# Patient Record
Sex: Female | Born: 1995 | Hispanic: No | Smoking: Current every day smoker
Health system: Southern US, Community
[De-identification: ages and names within clinical notes are randomized; demographics above are authoritative.]

## PROBLEM LIST (undated history)

## (undated) DIAGNOSIS — H5440 Blindness, one eye, unspecified eye: Secondary | ICD-10-CM

## (undated) DIAGNOSIS — E119 Type 2 diabetes mellitus without complications: Secondary | ICD-10-CM

---
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
## 2014-11-12 ENCOUNTER — Encounter (HOSPITAL_COMMUNITY): Payer: Self-pay | Admitting: Emergency Medicine

## 2014-11-12 ENCOUNTER — Emergency Department (HOSPITAL_COMMUNITY)
Admission: EM | Admit: 2014-11-12 | Discharge: 2014-11-12 | Disposition: A | Discharge status: Home / Self Care | Payer: No Typology Code available for payment source | Attending: Emergency Medicine | Admitting: Emergency Medicine

## 2014-11-12 ENCOUNTER — Emergency Department (HOSPITAL_COMMUNITY): Payer: No Typology Code available for payment source

## 2014-11-12 DIAGNOSIS — Y92410 Unspecified street and highway as the place of occurrence of the external cause: Secondary | ICD-10-CM | POA: Diagnosis not present

## 2014-11-12 DIAGNOSIS — S299XXA Unspecified injury of thorax, initial encounter: Secondary | ICD-10-CM | POA: Diagnosis not present

## 2014-11-12 DIAGNOSIS — Y9389 Activity, other specified: Secondary | ICD-10-CM | POA: Insufficient documentation

## 2014-11-12 DIAGNOSIS — Z720 Tobacco use: Secondary | ICD-10-CM | POA: Diagnosis not present

## 2014-11-12 DIAGNOSIS — S199XXA Unspecified injury of neck, initial encounter: Secondary | ICD-10-CM | POA: Insufficient documentation

## 2014-11-12 DIAGNOSIS — F419 Anxiety disorder, unspecified: Secondary | ICD-10-CM | POA: Insufficient documentation

## 2014-11-12 DIAGNOSIS — Z043 Encounter for examination and observation following other accident: Secondary | ICD-10-CM

## 2014-11-12 DIAGNOSIS — Z041 Encounter for examination and observation following transport accident: Secondary | ICD-10-CM

## 2014-11-12 DIAGNOSIS — Y998 Other external cause status: Secondary | ICD-10-CM | POA: Diagnosis not present

## 2014-11-12 DIAGNOSIS — S80812A Abrasion, left lower leg, initial encounter: Secondary | ICD-10-CM | POA: Diagnosis not present

## 2014-11-12 MED ORDER — CYCLOBENZAPRINE HCL 10 MG PO TABS
10.0000 mg | ORAL_TABLET | Freq: Two times a day (BID) | ORAL | Status: AC | PRN
Start: 2014-11-12 — End: ?

## 2014-11-12 MED ORDER — LORAZEPAM 2 MG/ML IJ SOLN: 1.0000 mg | Freq: Once | INTRAMUSCULAR | Status: DC

## 2014-11-12 MED ORDER — ALPRAZOLAM 0.25 MG PO TABS
0.5000 mg | ORAL_TABLET | Freq: Once | ORAL | Status: AC
  Administered 2014-11-12: 0.5 mg via ORAL
  Filled 2014-11-12: qty 2

## 2014-11-12 MED ORDER — MORPHINE SULFATE 4 MG/ML IJ SOLN: 4.0000 mg | Freq: Once | INTRAMUSCULAR | Status: DC

## 2014-11-12 MED ORDER — SODIUM CHLORIDE 0.9 % IV BOLUS (SEPSIS): 1000.0000 mL | Freq: Once | INTRAVENOUS | Status: DC

## 2014-11-12 MED ORDER — CYCLOBENZAPRINE HCL 10 MG PO TABS
10.0000 mg | ORAL_TABLET | Freq: Once | ORAL | Status: AC
  Administered 2014-11-12: 10 mg via ORAL
  Filled 2014-11-12: qty 1

## 2014-11-12 MED ORDER — OXYCODONE-ACETAMINOPHEN 5-325 MG PO TABS
1.0000 | ORAL_TABLET | Freq: Once | ORAL | Status: AC
  Administered 2014-11-12: 1 via ORAL
  Filled 2014-11-12: qty 1

## 2014-11-12 MED ORDER — TRAMADOL HCL 50 MG PO TABS: 50.0000 mg | ORAL_TABLET | Freq: Four times a day (QID) | ORAL | Status: AC | PRN
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
## 2014-11-12 NOTE — ED Notes (Signed)
Pt refuses IV insertion, states afraid of needles and requesting PO meds. Candise BowensJen, PA notified.
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
## 2014-11-12 NOTE — ED Notes (Signed)
Jen, PA at the bedside.  
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
## 2022-05-03 ENCOUNTER — Other Ambulatory Visit: Payer: Self-pay

## 2022-05-03 ENCOUNTER — Encounter (HOSPITAL_BASED_OUTPATIENT_CLINIC_OR_DEPARTMENT_OTHER): Payer: Self-pay

## 2022-05-03 ENCOUNTER — Emergency Department (HOSPITAL_BASED_OUTPATIENT_CLINIC_OR_DEPARTMENT_OTHER)
Admission: EM | Admit: 2022-05-03 | Discharge: 2022-05-03 | Disposition: A | Discharge status: Home / Self Care | Payer: Medicaid Other | Attending: Emergency Medicine | Admitting: Emergency Medicine

## 2022-05-03 DIAGNOSIS — Z91010 Allergy to peanuts: Secondary | ICD-10-CM | POA: Diagnosis not present

## 2022-05-03 DIAGNOSIS — N939 Abnormal uterine and vaginal bleeding, unspecified: Secondary | ICD-10-CM | POA: Diagnosis present

## 2022-05-03 HISTORY — DX: Type 2 diabetes mellitus without complications: E11.9

## 2022-05-03 HISTORY — DX: Blindness, one eye, unspecified eye: H54.40

## 2022-05-03 LAB — CBC
HCT: 39.3 % (ref 36.0–46.0)
Hemoglobin: 13.1 g/dL (ref 12.0–15.0)
MCH: 26.5 pg (ref 26.0–34.0)
MCHC: 33.3 g/dL (ref 30.0–36.0)
MCV: 79.6 fL — ABNORMAL LOW (ref 80.0–100.0)
Platelets: 426 10*3/uL — ABNORMAL HIGH (ref 150–400)
RBC: 4.94 MIL/uL (ref 3.87–5.11)
RDW: 12.9 % (ref 11.5–15.5)
WBC: 12.3 10*3/uL — ABNORMAL HIGH (ref 4.0–10.5)
nRBC: 0 % (ref 0.0–0.2)

## 2022-05-03 LAB — PREGNANCY, URINE: Preg Test, Ur: NEGATIVE

## 2022-05-03 MED ORDER — MEDROXYPROGESTERONE ACETATE 5 MG PO TABS: 5.0000 mg | ORAL_TABLET | Freq: Every day | ORAL | 0 refills | Status: AC

## 2022-05-03 NOTE — ED Triage Notes (Signed)
Pt arrives to ED POV c/o Vaginal Bleeding x3 weeks. Per Pt bleeding started as "spotting" then turned into heavy bleeding. States she goes through multiple pads because its continuous bleeding. Pt has attempted to see her OBGYN but can't be seen until the end of January.

## 2022-05-03 NOTE — ED Provider Notes (Signed)
[facility] EMERGENCY DEPARTMENT  Provider Note  CSN: [id number] Arrival date & time: 05/03/22 1802  History Chief Complaint  Patient presents with   Vaginal Bleeding    Jeanne Roy is a 26 y.o. female here for 3 weeks of vaginal bleeding. Changing a pad every 2 days. Feeling weak. Seen at [facility] ED yesterday for same as well as GI illness. Had neg labs including Hgb and HCG. She is requesting Rx for Provera.    Home Medications Prior to Admission medications   Medication Sig Start Date End Date Taking? Authorizing Provider  medroxyPROGESTERone (PROVERA) 5 MG tablet Take 1 tablet (5 mg total) by mouth daily. 05/03/22  Yes Pollyann Savoy, MD  cyclobenzaprine (FLEXERIL) 10 MG tablet Take 1 tablet (10 mg total) by mouth 2 (two) times daily as needed for muscle spasms. 11/12/14   Piepenbrink, Victorino Dike, PA-C  traMADol (ULTRAM) 50 MG tablet Take 1 tablet (50 mg total) by mouth every 6 (six) hours as needed. 11/12/14   Piepenbrink, Victorino Dike, PA-C     Allergies    Peanut-containing drug products   Review of Systems   Review of Systems Please see HPI for pertinent positives and negatives  Physical Exam BP (!) 141/94 (BP Location: Left Arm) Comment: Simultaneous filing. User may not have seen previous data.  Pulse 86   Temp 98.2 F (36.8 C) (Oral)   Resp 18   Ht 5\' 4"  (1.626 m)   Wt 104.3 kg   SpO2 100%   BMI 39.48 kg/m   Physical Exam Vitals and nursing note reviewed.  Constitutional:      Appearance: Normal appearance.  HENT:     Head: Normocephalic and atraumatic.     Nose: Nose normal.     Mouth/Throat:     Mouth: Mucous membranes are moist.  Eyes:     Extraocular Movements: Extraocular movements intact.     Conjunctiva/sclera: Conjunctivae normal.  Cardiovascular:     Rate and Rhythm: Normal rate.  Pulmonary:     Effort: Pulmonary effort is normal.     Breath sounds: Normal breath sounds.  Abdominal:     General: Abdomen is flat.      Palpations: Abdomen is soft.     Tenderness: There is no abdominal tenderness.  Musculoskeletal:        General: No swelling. Normal range of motion.     Cervical back: Neck supple.  Skin:    General: Skin is warm and dry.  Neurological:     General: No focal deficit present.     Mental Status: She is alert.  Psychiatric:        Mood and Affect: Mood normal.     ED Results / Procedures / Treatments   EKG None  Procedures Procedures  Medications Ordered in the ED Medications - No data to display  Initial Impression and Plan  Patient here with 3 weeks of vaginal bleeding but not a significant amount. I discussed with her neg HCG and normal Hgb yesterday and again on labs done today. I do not feel Provera would necessarily be beneficial to her, but she is insistent. She has Gyn follow up already scheduled in 1 month.   ED Course       MDM Rules/Calculators/A&P Medical Decision Making Problems Addressed: Vaginal bleeding: acute illness or injury  Amount and/or Complexity of Data Reviewed Labs: ordered. Decision-making details documented in ED Course.  Risk Prescription drug management.    Final Clinical Impression(s) / ED Diagnoses  Final diagnoses:  Vaginal bleeding    Rx / DC Orders ED Discharge Orders          Ordered    medroxyPROGESTERone (PROVERA) 5 MG tablet  Daily        05/03/22 2310             Pollyann Savoy, MD 05/03/22 2310
